# Patient Record
Sex: Male | Born: 1955 | Race: Black or African American | Hispanic: No | Marital: Single | Smoking: Never smoker
Health system: Southern US, Community
[De-identification: ages and names within clinical notes are randomized; demographics above are authoritative.]

## PROBLEM LIST (undated history)

## (undated) DIAGNOSIS — R519 Headache, unspecified: Secondary | ICD-10-CM

## (undated) DIAGNOSIS — I10 Essential (primary) hypertension: Secondary | ICD-10-CM

## (undated) DIAGNOSIS — Z86010 Personal history of colonic polyps: Secondary | ICD-10-CM

## (undated) DIAGNOSIS — R51 Headache: Secondary | ICD-10-CM

## (undated) HISTORY — DX: Essential (primary) hypertension: I10

## (undated) HISTORY — PX: FRACTURE SURGERY: SHX138

## (undated) HISTORY — DX: Personal history of colonic polyps: Z86.010

## (undated) HISTORY — DX: Headache: R51

## (undated) HISTORY — DX: Headache, unspecified: R51.9

---

## 2013-05-15 ENCOUNTER — Ambulatory Visit (INDEPENDENT_AMBULATORY_CARE_PROVIDER_SITE_OTHER): Payer: 59 | Admitting: Emergency Medicine

## 2013-05-15 VITALS — BP 162/110 | HR 66 | Temp 98.9°F | Resp 18 | Ht 70.5 in | Wt 208.0 lb

## 2013-05-15 DIAGNOSIS — I10 Essential (primary) hypertension: Secondary | ICD-10-CM

## 2013-05-15 DIAGNOSIS — R350 Frequency of micturition: Secondary | ICD-10-CM

## 2013-05-15 DIAGNOSIS — N400 Enlarged prostate without lower urinary tract symptoms: Secondary | ICD-10-CM

## 2013-05-15 DIAGNOSIS — N529 Male erectile dysfunction, unspecified: Secondary | ICD-10-CM

## 2013-05-15 LAB — POCT UA - MICROSCOPIC ONLY
Casts, Ur, LPF, POC: NEGATIVE
Crystals, Ur, HPF, POC: NEGATIVE
Mucus, UA: POSITIVE
Yeast, UA: NEGATIVE

## 2013-05-15 LAB — POCT URINALYSIS DIPSTICK
Bilirubin, UA: NEGATIVE
Blood, UA: NEGATIVE
Glucose, UA: NEGATIVE
Ketones, UA: NEGATIVE
Leukocytes, UA: NEGATIVE
Nitrite, UA: NEGATIVE
Protein, UA: NEGATIVE
Spec Grav, UA: 1.02
Urobilinogen, UA: 1
pH, UA: 6.5

## 2013-05-15 LAB — POCT CBC
HCT, POC: 47.2 % (ref 43.5–53.7)
MCH, POC: 29.3 pg (ref 27–31.2)
MCV: 91.5 fL (ref 80–97)
MID (cbc): 0.5 (ref 0–0.9)
MPV: 9.2 fL (ref 0–99.8)
POC LYMPH PERCENT: 41.9 %L (ref 10–50)
POC MID %: 8.5 %M (ref 0–12)
Platelet Count, POC: 238 10*3/uL (ref 142–424)
RBC: 5.16 M/uL (ref 4.69–6.13)
RDW, POC: 13.8 %
WBC: 5.3 10*3/uL (ref 4.6–10.2)

## 2013-05-15 MED ORDER — VARDENAFIL HCL 20 MG PO TABS: 20.0000 mg | ORAL_TABLET | Freq: Every day | ORAL | Status: DC | PRN

## 2013-05-15 MED ORDER — TAMSULOSIN HCL 0.4 MG PO CAPS: 0.4000 mg | ORAL_CAPSULE | Freq: Every day | ORAL | Status: DC

## 2013-05-15 MED ORDER — LOSARTAN POTASSIUM-HCTZ 50-12.5 MG PO TABS: 1.0000 | ORAL_TABLET | Freq: Every day | ORAL | Status: DC

## 2013-05-15 NOTE — Progress Notes (Signed)
Urgent Medical and [facility] [address] [place] [address] [phone]  Date:  05/15/2013   Name:  Garrett Perez   DOB:  08/15/1955   MRN:  [id number]  PCP:  No primary provider on file.    Chief Complaint: Urinary Frequency and Headache   History of Present Illness:  Garrett Perez is a 57 y.o. very pleasant male patient who presents with the following:  Multiple complaints.  No history of hypertension.  Has three year history of morning headaches usually go away but may last all day. Not associated with neuro or visual symptoms.   Respond to OTC medications.   Has frequent urination, small voids, dribbling decreased stream.  Nocturia.   Has 2-3 month history of ED.  Not able to maintain an erection suitable for intercourse. Has noticed tingling in the ulnar distribution of his left arm when he sits with his chin in his left hand, leaning on a counter or chair arm.  Symptoms resolve when he moves his arm.  There are no active problems to display for this patient.   History reviewed. No pertinent past medical history.  Past Surgical History  Procedure Laterality Date  . Fracture surgery      History  Substance Use Topics  . Smoking status: Never Smoker   . Smokeless tobacco: Not on file  . Alcohol Use: Not on file    History reviewed. No pertinent family history.  No Known Allergies  Medication list has been reviewed and updated.  No current outpatient prescriptions on file prior to visit.   No current facility-administered medications on file prior to visit.    Review of Systems:  As per HPI, otherwise negative.    Physical Examination: Filed Vitals:   05/15/13 1305  BP: 162/110  Pulse: 66  Temp: 98.9 F (37.2 C)  Resp: 18   Filed Vitals:   05/15/13 1305  Height: 5' 10.5" (1.791 m)  Weight: 208 lb (94.348 kg)   Body mass index is 29.41 kg/(m^2). Ideal Body Weight: Weight in (lb) to have BMI = 25: 176.4  GEN: WDWN, NAD, Non-toxic, A & O x  3 HEENT: Atraumatic, Normocephalic. Neck supple. No masses, No LAD.  Fundi negative Ears and Nose: No external deformity. CV: RRR, No M/G/R. No JVD. No thrill. No extra heart sounds. PULM: CTA B, no wheezes, crackles, rhonchi. No retractions. No resp. distress. No accessory muscle use. ABD: S, NT, ND, +BS. No rebound. No HSM. EXTR: No c/c/e NEURO Normal gait.  PSYCH: Normally interactive. Conversant. Not depressed or anxious appearing.  Calm demeanor.    Assessment and Plan: Hypertension BPH with prostatism Headaches likely secondary to hypertension. LABS EKG GI consult for colonoscopy  Signed,  Phillips Odor, MD

## 2013-05-15 NOTE — Patient Instructions (Signed)
Hypertension As your heart beats, it forces blood through your arteries. This force is your blood pressure. If the pressure is too high, it is called hypertension (HTN) or high blood pressure. HTN is dangerous because you may have it and not know it. High blood pressure may mean that your heart has to work harder to pump blood. Your arteries may be narrow or stiff. The extra work puts you at risk for heart disease, stroke, and other problems.  Blood pressure consists of two numbers, a higher number over a lower, 110/72, for example. It is stated as "110 over 72." The ideal is below 120 for the top number (systolic) and under 80 for the bottom (diastolic). Write down your blood pressure today. You should pay close attention to your blood pressure if you have certain conditions such as:  Heart failure.  Prior heart attack.  Diabetes  Chronic kidney disease.  Prior stroke.  Multiple risk factors for heart disease. To see if you have HTN, your blood pressure should be measured while you are seated with your arm held at the level of the heart. It should be measured at least twice. A one-time elevated blood pressure reading (especially in the Emergency Department) does not mean that you need treatment. There may be conditions in which the blood pressure is different between your right and left arms. It is important to see your caregiver soon for a recheck. Most people have essential hypertension which means that there is not a specific cause. This type of high blood pressure may be lowered by changing lifestyle factors such as:  Stress.  Smoking.  Lack of exercise.  Excessive weight.  Drug/tobacco/alcohol use.  Eating less salt. Most people do not have symptoms from high blood pressure until it has caused damage to the body. Effective treatment can often prevent, delay or reduce that damage. TREATMENT  When a cause has been identified, treatment for high blood pressure is directed at the  cause. There are a large number of medications to treat HTN. These fall into several categories, and your caregiver will help you select the medicines that are best for you. Medications may have side effects. You should review side effects with your caregiver. If your blood pressure stays high after you have made lifestyle changes or started on medicines,   Your medication(s) may need to be changed.  Other problems may need to be addressed.  Be certain you understand your prescriptions, and know how and when to take your medicine.  Be sure to follow up with your caregiver within the time frame advised (usually within two weeks) to have your blood pressure rechecked and to review your medications.  If you are taking more than one medicine to lower your blood pressure, make sure you know how and at what times they should be taken. Taking two medicines at the same time can result in blood pressure that is too low. SEEK IMMEDIATE MEDICAL CARE IF:  You develop a severe headache, blurred or changing vision, or confusion.  You have unusual weakness or numbness, or a faint feeling.  You have severe chest or abdominal pain, vomiting, or breathing problems. MAKE SURE YOU:   Understand these instructions.  Will watch your condition.  Will get help right away if you are not doing well or get worse. Document Released: 05/03/2005 Document Revised: 07/26/2011 Document Reviewed: 12/22/2007 [facility] Patient Information 2014 [place]. Erectile Dysfunction Erectile dysfunction is the inability to get or sustain a good enough erection to  have sexual intercourse. Erectile dysfunction may involve:  Inability to get an erection.  Lack of enough hardness to allow penetration.  Loss of the erection before sex is finished.  Premature ejaculation. CAUSES  Certain drugs, such as:  Pain relievers.  Antihistamines.  Antidepressants.  Blood pressure medicines.  Water pills  (diuretics).  Ulcer medicines.  Muscle relaxants.  Illegal drugs.  Excessive drinking.  Psychological causes, such as:  Anxiety.  Depression.  Sadness.  Exhaustion.  Performance fear.  Stress.  Physical causes, such as:  Artery problems. This may include diabetes, smoking, liver disease, or atherosclerosis.  High blood pressure.  Hormonal problems, such as low testosterone.  Obesity.  Nerve problems. This may include back or pelvic injuries, diabetes mellitus, multiple sclerosis, or Parkinson disease. SYMPTOMS  Inability to get an erection.  Lack of enough hardness to allow penetration.  Loss of the erection before sex is finished.  Premature ejaculation.  Normal erections at some times, but with frequent unsatisfactory episodes.  Orgasms that are not satisfactory in sensation or frequency.  Low sexual satisfaction in either partner because of erection problems.  A curved penis occurring with erection. The curve may cause pain or may be too curved to allow for intercourse.  Never having nighttime erections. DIAGNOSIS Your caregiver can often diagnose this condition by:  Performing a physical exam to find other diseases or specific problems with the penis.  Asking you detailed questions about the problem.  Performing blood tests to check for diabetes mellitus or to measure hormone levels.  Performing urine tests to find other underlying health conditions.  Performing an ultrasound exam to check for scarring.  Performing a test to check blood flow to the penis.  Doing a sleep study at home to measure nighttime erections. TREATMENT   You may be prescribed medicines by mouth.  You may be given medicine injections into the penis.  You may be prescribed a vacuum pump with a ring.  Penile implant surgery may be performed. You may receive:  An inflatable implant.  A semirigid implant.  Blood vessel surgery may be performed. HOME CARE  INSTRUCTIONS  If you are prescribed oral medicine, you should take the medicine as prescribed. Do not increase the dosage without first discussing it with your physician.  If you are using self-injections, be careful to avoid any veins that are on the surface of the penis. Apply pressure to the injection site for 5 minutes.  If you are using a vacuum pump, make sure you have read the instructions before using it. Discuss any questions with your physician before taking the pump home. SEEK MEDICAL CARE IF:  You experience pain that is not responsive to the pain medicine you have been prescribed.  You experience nausea or vomiting. SEEK IMMEDIATE MEDICAL CARE IF:   When taking oral or injectable medications, you experience an erection that lasts longer than 4 hours. If your physician is unavailable, go to the nearest emergency room for evaluation. An erection that lasts much longer than 4 hours can result in permanent damage to your penis.  You have pain that is severe.  You develop redness, severe pain, or severe swelling of your penis.  You have redness spreading up into your groin or lower abdomen.  You are unable to pass your urine. Document Released: 04/30/2000 Document Revised: 01/03/2013 Document Reviewed: 10/05/2012 [facility] Patient Information 2014 [place], [place].

## 2013-05-16 ENCOUNTER — Encounter: Payer: Self-pay | Admitting: Internal Medicine

## 2013-05-16 LAB — COMPREHENSIVE METABOLIC PANEL WITH GFR
ALT: 26 U/L (ref 0–53)
AST: 19 U/L (ref 0–37)
Albumin: 4.5 g/dL (ref 3.5–5.2)
Alkaline Phosphatase: 94 U/L (ref 39–117)
BUN: 14 mg/dL (ref 6–23)
CO2: 29 meq/L (ref 19–32)
Calcium: 9.7 mg/dL (ref 8.4–10.5)
Chloride: 101 meq/L (ref 96–112)
Creat: 1.22 mg/dL (ref 0.50–1.35)
Glucose, Bld: 89 mg/dL (ref 70–99)
Potassium: 3.6 meq/L (ref 3.5–5.3)
Sodium: 139 meq/L (ref 135–145)
Total Bilirubin: 0.7 mg/dL (ref 0.3–1.2)
Total Protein: 7.8 g/dL (ref 6.0–8.3)

## 2013-05-16 LAB — LIPID PANEL
Cholesterol: 194 mg/dL (ref 0–200)
HDL: 51 mg/dL (ref >39)
LDL Cholesterol: 130 mg/dL — ABNORMAL HIGH (ref 0–99)
Total CHOL/HDL Ratio: 3.8 ratio
Triglycerides: 63 mg/dL (ref <150)
VLDL: 13 mg/dL (ref 0–40)

## 2013-05-16 LAB — TESTOSTERONE: Testosterone: 575 ng/dL (ref 300–890)

## 2013-05-16 LAB — PSA: PSA: 1.98 ng/mL (ref <=4.00)

## 2013-05-28 ENCOUNTER — Ambulatory Visit (AMBULATORY_SURGERY_CENTER): Payer: Self-pay

## 2013-05-28 VITALS — Ht 71.0 in | Wt 203.0 lb

## 2013-05-28 DIAGNOSIS — Z1211 Encounter for screening for malignant neoplasm of colon: Secondary | ICD-10-CM

## 2013-05-28 MED ORDER — SUPREP BOWEL PREP KIT 17.5-3.13-1.6 GM/177ML PO SOLN: 1.0000 | Freq: Once | ORAL | Status: DC

## 2013-05-30 ENCOUNTER — Encounter: Payer: Self-pay | Admitting: Internal Medicine

## 2013-06-11 ENCOUNTER — Encounter: Payer: Self-pay | Admitting: Internal Medicine

## 2013-06-11 ENCOUNTER — Ambulatory Visit (AMBULATORY_SURGERY_CENTER): Payer: 59 | Admitting: Internal Medicine

## 2013-06-11 VITALS — BP 151/93 | HR 54 | Temp 97.1°F | Resp 12 | Ht 71.0 in | Wt 203.0 lb

## 2013-06-11 DIAGNOSIS — Z860100 Personal history of colon polyps, unspecified: Secondary | ICD-10-CM

## 2013-06-11 DIAGNOSIS — Z1211 Encounter for screening for malignant neoplasm of colon: Secondary | ICD-10-CM

## 2013-06-11 DIAGNOSIS — D126 Benign neoplasm of colon, unspecified: Secondary | ICD-10-CM

## 2013-06-11 HISTORY — DX: Personal history of colonic polyps: Z86.010

## 2013-06-11 HISTORY — DX: Personal history of colon polyps, unspecified: Z86.0100

## 2013-06-11 MED ORDER — SODIUM CHLORIDE 0.9 % IV SOLN: 500.0000 mL | INTRAVENOUS | Status: DC

## 2013-06-11 NOTE — Progress Notes (Signed)
Room staff made aware of blood pressure of 151/11 left arm and 148/111 right arm no new orders received.

## 2013-06-11 NOTE — Progress Notes (Signed)
Procedure ends, to recovery, report given and VSS. 

## 2013-06-11 NOTE — Progress Notes (Signed)
Called to room to assist during endoscopic procedure.  Patient ID and intended procedure confirmed with present staff. Received instructions for my participation in the procedure from the performing physician.  

## 2013-06-11 NOTE — Patient Instructions (Addendum)
I found and removed 3 polyps today.  Everything else was ok.  I will let you know pathology results and when to have another routine colonoscopy by mail.  I appreciate the opportunity to care for you. Gatha Mayer, MD, FACG   YOU HAD AN ENDOSCOPIC PROCEDURE TODAY AT [facility]: Refer to the procedure report that was given to you for any specific questions about what was found during the examination.  If the procedure report does not answer your questions, please call your gastroenterologist to clarify.  If you requested that your care partner not be given the details of your procedure findings, then the procedure report has been included in a sealed envelope for you to review at your convenience later.  YOU SHOULD EXPECT: Some feelings of bloating in the abdomen. Passage of more gas than usual.  Walking can help get rid of the air that was put into your GI tract during the procedure and reduce the bloating. If you had a lower endoscopy (such as a colonoscopy or flexible sigmoidoscopy) you may notice spotting of blood in your stool or on the toilet paper. If you underwent a bowel prep for your procedure, then you may not have a normal bowel movement for a few days.  DIET: Your first meal following the procedure should be a light meal and then it is ok to progress to your normal diet.  A half-sandwich or bowl of soup is an example of a good first meal.  Heavy or fried foods are harder to digest and may make you feel nauseous or bloated.  Likewise meals heavy in dairy and vegetables can cause extra gas to form and this can also increase the bloating.  Drink plenty of fluids but you should avoid alcoholic beverages for 24 hours.  ACTIVITY: Your care partner should take you home directly after the procedure.  You should plan to take it easy, moving slowly for the rest of the day.  You can resume normal activity the day after the procedure however you should NOT DRIVE or use heavy  machinery for 24 hours (because of the sedation medicines used during the test).    SYMPTOMS TO REPORT IMMEDIATELY: A gastroenterologist can be reached at any hour.  During normal business hours, 8:30 AM to 5:00 PM Monday through Friday, call [phone].  After hours and on weekends, please call the GI answering service at [phone] who will take a message and have the physician on call contact you.   Following lower endoscopy (colonoscopy or flexible sigmoidoscopy):  Excessive amounts of blood in the stool  Significant tenderness or worsening of abdominal pains  Swelling of the abdomen that is new, acute  Fever of 100F or higher  Following upper endoscopy (EGD)  Vomiting of blood or coffee ground material  New chest pain or pain under the shoulder blades  Painful or persistently difficult swallowing  New shortness of breath  Fever of 100F or higher  Black, tarry-looking stools  FOLLOW UP: If any biopsies were taken you will be contacted by phone or by letter within the next 1-3 weeks.  Call your gastroenterologist if you have not heard about the biopsies in 3 weeks.  Our staff will call the home number listed on your records the next business day following your procedure to check on you and address any questions or concerns that you may have at that time regarding the information given to you following your procedure. This is a Manufacturing engineer  call and so if there is no answer at the home number and we have not heard from you through the emergency physician on call, we will assume that you have returned to your regular daily activities without incident.  SIGNATURES/CONFIDENTIALITY: You and/or your care partner have signed paperwork which will be entered into your electronic medical record.  These signatures attest to the fact that that the information above on your After Visit Summary has been reviewed and is understood.  Full responsibility of the confidentiality of this discharge  information lies with you and/or your care-partner.   Information on polyps given to you today  Hold aspirin and anti inflammatory products for 2 weeks

## 2013-06-11 NOTE — Op Note (Signed)
[facility]  [address] [place], [address]   COLONOSCOPY PROCEDURE REPORT  PATIENT: Garrett Perez, Garrett Perez  MR#: [id number] BIRTHDATE: [birth year] , 69  yrs. old GENDER: Male ENDOSCOPIST: Gatha Mayer, MD, [facility] REFERRED BY:   Synetta Shadow, MD PROCEDURE DATE:  06/11/2013 PROCEDURE:   Colonoscopy with snare polypectomy First Screening Colonoscopy - Avg.  risk and is 50 yrs.  old or older Yes.  Prior Negative Screening - Now for repeat screening. N/A  History of Adenoma - Now for follow-up colonoscopy & has been > or = to 3 yrs.  N/A  Polyps Removed Today? Yes. ASA CLASS:   Class II INDICATIONS:average risk screening and first colonoscopy. MEDICATIONS: propofol (Diprivan) 250mg  IV  DESCRIPTION OF PROCEDURE:   After the risks benefits and alternatives of the procedure were thoroughly explained, informed consent was obtained.  A digital rectal exam revealed no abnormalities of the rectum, A digital rectal exam revealed no prostatic nodules, and A digital rectal exam revealed the prostate was not enlarged.   The LB [id number]  endoscope was introduced through the anus and advanced to the cecum, which was identified by both the appendix and ileocecal valve. No adverse events experienced.   The quality of the prep was Suprep good  The instrument was then slowly withdrawn as the colon was fully examined.  COLON FINDINGS: Three sessile polyps measuring 3, 5 and 15 mm in size were found at the cecum and in the ascending colon.  A polypectomy was performed with a cold snare (3 and 5 mm)  and using snare cautery (15).  The resection was complete and the polyp tissue was completely retrieved.   The colon mucosa was otherwise normal.   A right colon retroflexion was performed.  Retroflexed views revealed hypertrophied anal papilla. The time to cecum=3 minutes 27 seconds.  Withdrawal time=13 minutes 25 seconds.  The scope was withdrawn and the procedure  completed. COMPLICATIONS: There were no complications.  ENDOSCOPIC IMPRESSION: 1.   Three sessile polyps measuring 3, 5 and 15 mm in size were found at the cecum and in the ascending colon; polypectomy was performed with a cold snare and using snare cautery 2.   The colon mucosa was otherwise normal - good prep - first colonoscopy. He does have a hypertorophied anal papilla  RECOMMENDATIONS: 1.  Timing of repeat colonoscopy will be determined by pathology findings. 2.  Hold aspirin, aspirin products, and anti-inflammatory medication for 2 weeks.   eSigned:  Gatha Mayer, MD, [facility] 06/11/2013 2:00 PM   cc: The Patient  and  Synetta Shadow, MD

## 2013-06-12 ENCOUNTER — Telehealth: Payer: Self-pay | Admitting: *Deleted

## 2013-06-12 NOTE — Telephone Encounter (Signed)
  Follow up Call-  Call back number 06/11/2013  Post procedure Call Back phone  # [phone]  Permission to leave phone message Yes     Patient questions:  Message left for patient to call us if needed.

## 2013-06-14 ENCOUNTER — Encounter: Payer: Self-pay | Admitting: Internal Medicine

## 2013-06-14 NOTE — Progress Notes (Signed)
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

## 2013-09-26 ENCOUNTER — Other Ambulatory Visit: Payer: Self-pay | Admitting: Emergency Medicine

## 2013-11-23 ENCOUNTER — Ambulatory Visit (INDEPENDENT_AMBULATORY_CARE_PROVIDER_SITE_OTHER): Payer: 59 | Admitting: Emergency Medicine

## 2013-11-23 VITALS — BP 134/92 | HR 60 | Temp 97.8°F | Resp 16 | Ht 70.25 in | Wt 208.2 lb

## 2013-11-23 DIAGNOSIS — G44209 Tension-type headache, unspecified, not intractable: Secondary | ICD-10-CM

## 2013-11-23 DIAGNOSIS — I10 Essential (primary) hypertension: Secondary | ICD-10-CM

## 2013-11-23 MED ORDER — BUTALBITAL-APAP-CAFFEINE 50-325-40 MG PO TABS: 1.0000 | ORAL_TABLET | Freq: Two times a day (BID) | ORAL | Status: DC | PRN

## 2013-11-23 MED ORDER — LOSARTAN POTASSIUM-HCTZ 100-25 MG PO TABS: 1.0000 | ORAL_TABLET | Freq: Every day | ORAL | Status: DC

## 2013-11-23 NOTE — Progress Notes (Signed)
[facility]gent Medical and [facility] [address] [place] [address] [phone]  Date:  11/23/2013   Name:  Garrett Perez   DOB:  05-26-1955   MRN:  [id number]  PCP:  No PCP Per Patient    Chief Complaint: Headache   History of Present Illness:  Garrett Perez is a 58 y.o. very pleasant male patient who presents with the following:  Says he has a history of headaches over the past week.  Says this morning was back of head and now between his eyes.  Often bitemporal.    No photophobia or sensitivity to sound.  History of hypertension under treatment.  No associated neuro or visual symptoms.  No neck pain, cough, coryza, nausea or vomiting.  No stool change, fever or chills.  Says is compliant with medications.  No improvement with over the counter medications or other home remedies. Denies other complaint or health concern today.   Patient Active Problem List   Diagnosis Date Noted  . Personal history of colonic polyps - adenomas 06/11/2013    Past Medical History  Diagnosis Date  . Headache   . Hypertension   . Personal history of colonic polyps - adenomas 06/11/2013    06/11/2013 15 mm cecal polyp, 3 mm cecal polyp, 5 mm ascending polyp - adenomas - repeat colonoscopy 2018      Past Surgical History  Procedure Laterality Date  . Fracture surgery      History  Substance Use Topics  . Smoking status: Never Smoker   . Smokeless tobacco: Never Used  . Alcohol Use: No    Family History  Problem Relation Age of Onset  . Colon cancer Neg Hx     No Known Allergies  Medication list has been reviewed and updated.  Current Outpatient Prescriptions on File Prior to Visit  Medication Sig Dispense Refill  . Aspirin-Acetaminophen-Caffeine (GOODY HEADACHE PO) Take 1 packet by mouth as needed.      Marland Kitchen losartan-hydrochlorothiazide (HYZAAR) 50-12.5 MG per tablet TAKE 1 TABLET BY MOUTH DAILY.  30 tablet  0   No current facility-administered medications on file prior to visit.     Review of Systems:  As per HPI, otherwise negative.    Physical Examination: Filed Vitals:   11/23/13 1715  BP: 134/92  Pulse: 60  Temp: 97.8 F (36.6 C)  Resp: 16   Filed Vitals:   11/23/13 1715  Height: 5' 10.25" (1.784 m)  Weight: 208 lb 3.2 oz (94.439 kg)   Body mass index is 29.67 kg/(m^2). Ideal Body Weight: Weight in (lb) to have BMI = 25: 175.1  GEN: WDWN, NAD, Non-toxic, A & O x 3 HEENT: Atraumatic, Normocephalic. Neck supple. No masses, No LAD. Ears and Nose: No external deformity. CV: RRR, No M/G/R. No JVD. No thrill. No extra heart sounds. PULM: CTA B, no wheezes, crackles, rhonchi. No retractions. No resp. distress. No accessory muscle use. ABD: S, NT, ND, +BS. No rebound. No HSM. EXTR: No c/c/e NEURO Normal gait.  PSYCH: Normally interactive. Conversant. Not depressed or anxious appearing.  Calm demeanor.    Assessment and Plan: Hypertension Increase meds  Tension headache fioricet  Signed,  Ellison Carwin, MD

## 2013-11-23 NOTE — Patient Instructions (Signed)

## 2014-04-10 ENCOUNTER — Telehealth: Payer: Self-pay

## 2014-04-10 NOTE — Telephone Encounter (Signed)
Pt's dentist office called to report on pt's BP and see if it was normal for him and if OK to give anesthesia for dental work. They stated they have checked it twice a wk apart and both times the diastolic was over 809 (she didn't have the exact reading in front of her, but thought it was 109). I advised we haven't seen pt since July and that if it is that high, we need to see pt back for re-check to get this under control. Nurse agreed to call pt, delay dental work, and have him come back in for BP check up asap.

## 2015-06-13 ENCOUNTER — Ambulatory Visit
Admission: RE | Admit: 2015-06-13 | Discharge: 2015-06-13 | Disposition: A | Discharge status: Home / Self Care | Payer: Managed Care, Other (non HMO) | Source: Ambulatory Visit | Attending: Internal Medicine | Admitting: Internal Medicine

## 2015-06-13 ENCOUNTER — Other Ambulatory Visit: Payer: Self-pay | Admitting: Internal Medicine

## 2015-06-13 DIAGNOSIS — K5900 Constipation, unspecified: Secondary | ICD-10-CM

## 2016-05-31 ENCOUNTER — Encounter: Payer: Self-pay | Admitting: Internal Medicine

## 2016-06-25 DIAGNOSIS — Z0000 Encounter for general adult medical examination without abnormal findings: Secondary | ICD-10-CM | POA: Diagnosis not present

## 2016-07-26 DIAGNOSIS — D7289 Other specified disorders of white blood cells: Secondary | ICD-10-CM | POA: Diagnosis not present

## 2016-08-02 DIAGNOSIS — D721 Eosinophilia: Secondary | ICD-10-CM | POA: Diagnosis not present

## 2019-11-28 ENCOUNTER — Ambulatory Visit: Payer: Self-pay

## 2019-11-28 ENCOUNTER — Encounter (HOSPITAL_BASED_OUTPATIENT_CLINIC_OR_DEPARTMENT_OTHER): Payer: Self-pay | Admitting: *Deleted

## 2019-11-28 ENCOUNTER — Emergency Department (HOSPITAL_BASED_OUTPATIENT_CLINIC_OR_DEPARTMENT_OTHER): Payer: 59

## 2019-11-28 ENCOUNTER — Emergency Department (HOSPITAL_BASED_OUTPATIENT_CLINIC_OR_DEPARTMENT_OTHER)
Admission: EM | Admit: 2019-11-28 | Discharge: 2019-11-28 | Disposition: A | Discharge status: Home / Self Care | Payer: 59 | Attending: Emergency Medicine | Admitting: Emergency Medicine

## 2019-11-28 ENCOUNTER — Encounter: Payer: Self-pay | Admitting: Family Medicine

## 2019-11-28 ENCOUNTER — Ambulatory Visit: Payer: 59 | Admitting: Family Medicine

## 2019-11-28 ENCOUNTER — Other Ambulatory Visit: Payer: Self-pay

## 2019-11-28 VITALS — BP 151/94 | HR 70 | Ht 70.0 in | Wt 183.0 lb

## 2019-11-28 DIAGNOSIS — I10 Essential (primary) hypertension: Secondary | ICD-10-CM | POA: Insufficient documentation

## 2019-11-28 DIAGNOSIS — M25562 Pain in left knee: Secondary | ICD-10-CM | POA: Diagnosis present

## 2019-11-28 DIAGNOSIS — M1712 Unilateral primary osteoarthritis, left knee: Secondary | ICD-10-CM | POA: Diagnosis not present

## 2019-11-28 DIAGNOSIS — M25462 Effusion, left knee: Secondary | ICD-10-CM | POA: Diagnosis not present

## 2019-11-28 MED ORDER — IBUPROFEN-FAMOTIDINE 800-26.6 MG PO TABS: 1.0000 | ORAL_TABLET | Freq: Three times a day (TID) | ORAL | 3 refills | Status: DC | PRN

## 2019-11-28 MED ORDER — PREDNISONE 5 MG PO TABS
ORAL_TABLET | ORAL | 0 refills | Status: DC
Start: 2019-11-28 — End: 2021-07-07

## 2019-11-28 MED FILL — predniSONE 5 MG TABS: 5 | 6 days supply | Qty: 21 | Fill #0

## 2019-11-28 NOTE — Progress Notes (Signed)
Garrett Perez - 64 y.o. male MRN [id number]  Date of birth: [birth year]  SUBJECTIVE:  Including CC & ROS.  Chief Complaint  Patient presents with  . Knee Pain    left x 1 week    Garrett Perez is a 64 y.o. male that is presenting with acute left knee pain.  No history of similar pain.  He works 2 different jobs.  He walks on along with a second job.  He denies any inciting event or trauma.  Started having knee swelling that is progressively gotten worse.  He lacks full range of motion.  No history of gout.  No history of surgery..  Independent review of the left knee x-ray from 7/14 shows signs of an effusion.  Has moderate lateral joint space changes.   Review of Systems See HPI   HISTORY: Past Medical, Surgical, Social, and Family History Reviewed & Updated per EMR.   Pertinent Historical Findings include:  Past Medical History:  Diagnosis Date  . Headache   . Hypertension   . Personal history of colonic polyps - adenomas 06/11/2013   06/11/2013 15 mm cecal polyp, 3 mm cecal polyp, 5 mm ascending polyp - adenomas - repeat colonoscopy 2018      Past Surgical History:  Procedure Laterality Date  . FRACTURE SURGERY     Right hand    Family History  Problem Relation Age of Onset  . Colon cancer Neg Hx     Social History   Socioeconomic History  . Marital status: Single    Spouse name: Not on file  . Number of children: Not on file  . Years of education: Not on file  . Highest education level: Not on file  Occupational History  . Not on file  Tobacco Use  . Smoking status: Never Smoker  . Smokeless tobacco: Never Used  Substance and Sexual Activity  . Alcohol use: No  . Drug use: No  . Sexual activity: Yes  Other Topics Concern  . Not on file  Social History Narrative  . Not on file   Social Determinants of Health   Financial Resource Strain:   . Difficulty of Paying Living Expenses:   Food Insecurity:   . Worried About Charity fundraiser in the Last Year:   .  Arboriculturist in the Last Year:   Transportation Needs:   . Film/video editor (Medical):   Marland Kitchen Lack of Transportation (Non-Medical):   Physical Activity:   . Days of Exercise per Week:   . Minutes of Exercise per Session:   Stress:   . Feeling of Stress :   Social Connections:   . Frequency of Communication with Friends and Family:   . Frequency of Social Gatherings with Friends and Family:   . Attends Religious Services:   . Active Member of Clubs or Organizations:   . Attends Archivist Meetings:   Marland Kitchen Marital Status:   Intimate Partner Violence:   . Fear of Current or Ex-Partner:   . Emotionally Abused:   Marland Kitchen Physically Abused:   . Sexually Abused:      PHYSICAL EXAM:  VS: BP (!) 151/94   Pulse 70   Ht 5\' 10"  (1.778 m)   Wt 183 lb (83 kg)   BMI 26.26 kg/m  Physical Exam Gen: NAD, alert, cooperative with exam, well-appearing MSK:  Left knee: Moderate effusion. Limited extension. Normal strength resistance. Tenderness to palpation of lateral joint space. Neurovascular intact  Limited ultrasound:  Left knee:  Moderate effusion. Normal-appearing quadricep and patellar tendon. Normal-appearing medial joint space and meniscus. Moderate to severe lateral joint space narrowing with hyperechoic changes to suggest possible pseudogout.  Summary: Degenerative changes lateral compartment with possible pseudogout changes.  Ultrasound and interpretation by Clearance Coots, MD    ASSESSMENT & PLAN:   Primary osteoarthritis of left knee No history of similar pain at [facility] without incident.  Has hyperemia over the lateral aspect of the joint space as well as the femoral condyle.  Possible for pseudogout to be contributing.  He does have degenerative changes in the lateral aspect. -Counseled on home exercise therapy and supportive care. -Prednisone. -Duexis. -Could consider aspiration injection if no improvement.

## 2019-11-28 NOTE — ED Triage Notes (Addendum)
Left knee pain for over a week.  Denies injury.

## 2019-11-28 NOTE — Patient Instructions (Signed)
Nice to meet you Please try ice  Please continue compression  Please try the prednisone  Please use the duexis as needed after the prednisone  Cut down your running and then slowly build back up as your pain allows   Please send me a message in MyChart with any questions or updates.  Please see me back in 4 weeks.   --Dr. Raeford Razor

## 2019-11-28 NOTE — Discharge Instructions (Signed)
Your history and exam are consistent with knee pain likely related to inflammatory arthritis.  We discussed the possibility of doing an aspiration to look for gout or infection however given her otherwise reassuring exam and improving symptoms, we agreed together to hold on the aspiration and instead treat you with a more sturdy knee sleeve, sports medicine follow-up, anti-inflammatory regimen to see if this improves and close follow-up.  Please use the rest, ice, compression, elevation to help with the discomfort.  Please rest for the next 2 days.  If any symptoms change or worsen, please return as you would likely need knee aspiration to further evaluate at that time.

## 2019-11-28 NOTE — Assessment & Plan Note (Signed)
No history of similar pain at [facility] without incident.  Has hyperemia over the lateral aspect of the joint space as well as the femoral condyle.  Possible for pseudogout to be contributing.  He does have degenerative changes in the lateral aspect. -Counseled on home exercise therapy and supportive care. -Prednisone. -Duexis. -Could consider aspiration injection if no improvement.

## 2019-11-28 NOTE — ED Provider Notes (Signed)
[facility] EMERGENCY DEPARTMENT Provider Note   CSN: [id number] Arrival date & time: 11/28/19  8119     History Chief Complaint  Patient presents with  . Knee Pain    Garrett Perez is a 64 y.o. male.  The history is provided by the patient and medical records. No language interpreter was used.  Knee Pain Location:  Knee Time since incident:  6 days Injury: no   Knee location:  L knee Pain details:    Quality:  Aching   Radiates to:  Does not radiate   Severity:  Moderate   Onset quality:  Gradual   Duration:  6 days   Timing:  Sporadic   Progression:  Resolved Chronicity:  New Dislocation: no   Tetanus status:  Unknown Prior injury to area:  No Relieved by:  Immobilization Worsened by:  Nothing Ineffective treatments:  None tried Associated symptoms: stiffness and swelling   Associated symptoms: no back pain, no decreased ROM, no fatigue, no fever, no itching, no muscle weakness, no neck pain, no numbness and no tingling        Past Medical History:  Diagnosis Date  . Headache   . Hypertension   . Personal history of colonic polyps - adenomas 06/11/2013   06/11/2013 15 mm cecal polyp, 3 mm cecal polyp, 5 mm ascending polyp - adenomas - repeat colonoscopy 2018      Patient Active Problem List   Diagnosis Date Noted  . Personal history of colonic polyps - adenomas 06/11/2013    Past Surgical History:  Procedure Laterality Date  . FRACTURE SURGERY     Right hand       Family History  Problem Relation Age of Onset  . Colon cancer Neg Hx     Social History   Tobacco Use  . Smoking status: Never Smoker  . Smokeless tobacco: Never Used  Substance Use Topics  . Alcohol use: No  . Drug use: No    Home Medications Prior to Admission medications   Not on File    Allergies    Patient has no known allergies.  Review of Systems   Review of Systems  Constitutional: Negative for chills, fatigue and fever.  HENT: Negative for congestion.    Eyes: Negative for visual disturbance.  Respiratory: Negative for cough, chest tightness, shortness of breath and wheezing.   Cardiovascular: Negative for chest pain and palpitations.  Gastrointestinal: Negative for constipation, diarrhea, nausea and vomiting.  Genitourinary: Negative for flank pain.  Musculoskeletal: Positive for joint swelling and stiffness. Negative for back pain, neck pain and neck stiffness.  Skin: Negative for itching, rash and wound.  Neurological: Negative for headaches.  Psychiatric/Behavioral: Negative for agitation.  All other systems reviewed and are negative.   Physical Exam Updated Vital Signs BP (!) 171/114 (BP Location: Left Arm)   Pulse 73   Temp 98.2 F (36.8 C) (Oral)   Resp 18   Ht 5\' 10"  (1.778 m)   Wt 83 kg   SpO2 98%   BMI 26.26 kg/m   Physical Exam Constitutional:      General: He is not in acute distress.    Appearance: He is well-developed. He is not ill-appearing, toxic-appearing or diaphoretic.  HENT:     Head: Normocephalic and atraumatic.     Right Ear: External ear normal.     Left Ear: External ear normal.     Nose: Nose normal.  Eyes:     Conjunctiva/sclera: Conjunctivae normal.  Cardiovascular:  Rate and Rhythm: Normal rate.     Pulses: Normal pulses.  Pulmonary:     Effort: Pulmonary effort is normal. No respiratory distress.     Breath sounds: No stridor. No wheezing, rhonchi or rales.  Chest:     Chest wall: No tenderness.  Abdominal:     General: Abdomen is flat.     Palpations: Abdomen is soft.     Tenderness: There is no abdominal tenderness. There is no left CVA tenderness, guarding or rebound.  Musculoskeletal:        General: Tenderness present. No swelling.     Cervical back: Normal range of motion and neck supple.     Right lower leg: No edema.     Left lower leg: No edema.  Skin:    General: Skin is warm.     Capillary Refill: Capillary refill takes less than 2 seconds.     Coloration: Skin is  not pale.     Findings: No erythema or rash.  Neurological:     Mental Status: He is alert and oriented to person, place, and time.     Sensory: No sensory deficit.     Motor: No weakness or abnormal muscle tone.  Psychiatric:        Mood and Affect: Mood normal.     ED Results / Procedures / Treatments   Labs (all labs ordered are listed, but only abnormal results are displayed) Labs Reviewed - No data to display  EKG None  Radiology DG Knee Complete 4 Views Left  Result Date: 11/28/2019 CLINICAL DATA:  Left knee pain and swelling for 1 week. EXAM: LEFT KNEE - COMPLETE 4+ VIEW COMPARISON:  None. FINDINGS: Moderate tricompartmental degenerative changes most notable in the lateral compartment with joint space narrowing and spurring. No acute bony abnormality or osteochondral lesion. No definite erosions or chondrocalcinosis. Moderate to large joint effusion noted. IMPRESSION: 1. Moderate tricompartmental degenerative changes most notable in the lateral compartment. 2. No acute bony abnormality. 3. Moderate to large joint effusion. Electronically Signed   By: Marijo Sanes M.D.   On: 11/28/2019 08:33    Procedures Procedures (including critical care time)  Medications Ordered in ED Medications - No data to display  ED Course  I have reviewed the triage vital signs and the nursing notes.  Pertinent labs & imaging results that were available during my care of the patient were reviewed by me and considered in my medical decision making (see chart for details).    MDM Rules/Calculators/A&P                          Garrett Perez is a 64 y.o. male with a past medical history significant for hypertension who presents with left knee pain.  He reports that he works 2 jobs and is on his feet all the time.  He reports the last 6 or 7 days he is having some left knee pain and swelling.  He reports it has been gradually getting better since he started using knee immobilizer and only hurts when  he stopped moving for a period of time.  He feels like it starts to freeze.  He reports it does not hurt when he is not moving or when he is walking.  It does not hurt when he is bending his knee.  He reports no history of gout, septic joint, or trauma.  He denies history of DVT or PE.  He denies any numbness,  tingling, or weakness of the leg.  Denies any pain in the hip, chest, back, abdomen, or any infectious symptoms.  He denies any overlying erythema warmth, or redness.  He denies any overlying skin infections.  He reports that a knee sleeve is started to help but he had too much pain last night to be able to work prompting him to come in.  On exam, lungs are clear and chest is nontender.  Abdomen is nontender.  Has good pulses in both DP and PT arteries in both feet.  Patient's left knee is more swollen compared to the right but there is no overlying erythema.  Minimal tenderness on exam.  Minimal pain with knee manipulation.  No overlying cellulitis seen.  No bony tenderness.  Clinical aspect patient has some inflammatory arthritis likely related to overuse.  He has no history of gout and we discussed the possibility of needing to a knee aspiration however he would rather get x-ray first to evaluate.  As his symptoms are already improving, we will discuss further management after x-rays completed.  Given his lack of constant pain or pain with knee manipulation I have low suspicion that patient has a septic arthritis at this time.  Will discuss further management with patient.  11:02 AM Knee x-ray showed tricompartmental degenerative disease and effusion.  No bony abnormalities otherwise seen.  Patient still is pain-free when he is at rest and only hurts after he is ambulating and then starts to relax.  I suspect this is more a reactive or inflammatory arthropathy.  We discussed the possibility of knee aspiration to look for gout or infection however given his improving symptoms, patient agrees to hold  on this and instead follow-up with sports medicine, use rice therapy, use a more substantial knee sleeve, and take anti-inflammatory medications.  However, if symptoms worsen or he develops any symptoms of infection or worsened pain, patient does agree to return so that he can have any aspiration to rule out infection or gout.  Patient understands plan of care and has no other questions or concerns.  Patient discharged in good condition with improving symptoms.     Final Clinical Impression(s) / ED Diagnoses Final diagnoses:  Acute pain of left knee  Effusion of left knee  Arthritis of knee, left    Rx / DC Orders ED Discharge Orders    None     Clinical Impression: 1. Acute pain of left knee   2. Effusion of left knee   3. Arthritis of knee, left     Disposition: Discharge  Condition: Good  I have discussed the results, Dx and Tx plan with the pt(& family if present). He/she/they expressed understanding and agree(s) with the plan. Discharge instructions discussed at great length. Strict return precautions discussed and pt &/or family have verbalized understanding of the instructions. No further questions at time of discharge.    New Prescriptions   No medications on file    Follow Up: Rosemarie Ax, MD [address] [place]  [id number] [phone]        Mccall Will, Gwenyth Allegra, MD 11/28/19 [phone]

## 2019-12-31 ENCOUNTER — Other Ambulatory Visit: Payer: Self-pay

## 2019-12-31 ENCOUNTER — Encounter: Payer: Self-pay | Admitting: Family Medicine

## 2019-12-31 ENCOUNTER — Ambulatory Visit: Payer: 59 | Admitting: Family Medicine

## 2019-12-31 DIAGNOSIS — M1712 Unilateral primary osteoarthritis, left knee: Secondary | ICD-10-CM

## 2019-12-31 NOTE — Assessment & Plan Note (Signed)
Has been doing well with conservative measures.  He has been able to return to normal activities. -Counseled on home exercise therapy and supportive care. -Could consider physical therapy.

## 2019-12-31 NOTE — Progress Notes (Signed)
  Bernon Arviso - 64 y.o. male MRN [id number]  Date of birth: 1956/02/19  SUBJECTIVE:  Including CC & ROS.  Chief Complaint  Patient presents with  . Follow-up    left knee    Toan Mort is a 64 y.o. male that is following up for his left knee.  Has been doing well with little to no pain.  Has been able to return to his normal exercises.   Review of Systems See HPI   HISTORY: Past Medical, Surgical, Social, and Family History Reviewed & Updated per EMR.   Pertinent Historical Findings include:  Past Medical History:  Diagnosis Date  . Headache   . Hypertension   . Personal history of colonic polyps - adenomas 06/11/2013   06/11/2013 15 mm cecal polyp, 3 mm cecal polyp, 5 mm ascending polyp - adenomas - repeat colonoscopy 2018      Past Surgical History:  Procedure Laterality Date  . FRACTURE SURGERY     Right hand    Family History  Problem Relation Age of Onset  . Colon cancer Neg Hx     Social History   Socioeconomic History  . Marital status: Single    Spouse name: Not on file  . Number of children: Not on file  . Years of education: Not on file  . Highest education level: Not on file  Occupational History  . Not on file  Tobacco Use  . Smoking status: Never Smoker  . Smokeless tobacco: Never Used  Substance and Sexual Activity  . Alcohol use: No  . Drug use: No  . Sexual activity: Yes  Other Topics Concern  . Not on file  Social History Narrative  . Not on file   Social Determinants of Health   Financial Resource Strain:   . Difficulty of Paying Living Expenses:   Food Insecurity:   . Worried About Charity fundraiser in the Last Year:   . Arboriculturist in the Last Year:   Transportation Needs:   . Film/video editor (Medical):   Marland Kitchen Lack of Transportation (Non-Medical):   Physical Activity:   . Days of Exercise per Week:   . Minutes of Exercise per Session:   Stress:   . Feeling of Stress :   Social Connections:   . Frequency of  Communication with Friends and Family:   . Frequency of Social Gatherings with Friends and Family:   . Attends Religious Services:   . Active Member of Clubs or Organizations:   . Attends Archivist Meetings:   Marland Kitchen Marital Status:   Intimate Partner Violence:   . Fear of Current or Ex-Partner:   . Emotionally Abused:   Marland Kitchen Physically Abused:   . Sexually Abused:      PHYSICAL EXAM:  VS: BP (!) 155/102   Pulse 60   Ht 5\' 10"  (1.778 m)   Wt 185 lb (83.9 kg)   BMI 26.54 kg/m  Physical Exam Gen: NAD, alert, cooperative with exam, well-appearing MSK:  Left knee: Normal range of motion. Neurovascularly intact     ASSESSMENT & PLAN:   Primary osteoarthritis of left knee Has been doing well with conservative measures.  He has been able to return to normal activities. -Counseled on home exercise therapy and supportive care. -Could consider physical therapy.

## 2021-04-06 IMAGING — CR DG KNEE COMPLETE 4+V*L*
4 series · 4 of 4 positions shown · non-contrast
Comparison: None.

CLINICAL DATA: Left knee pain and swelling for 1 week.

EXAM:
LEFT KNEE - COMPLETE 4+ VIEW

[t knee ap left]
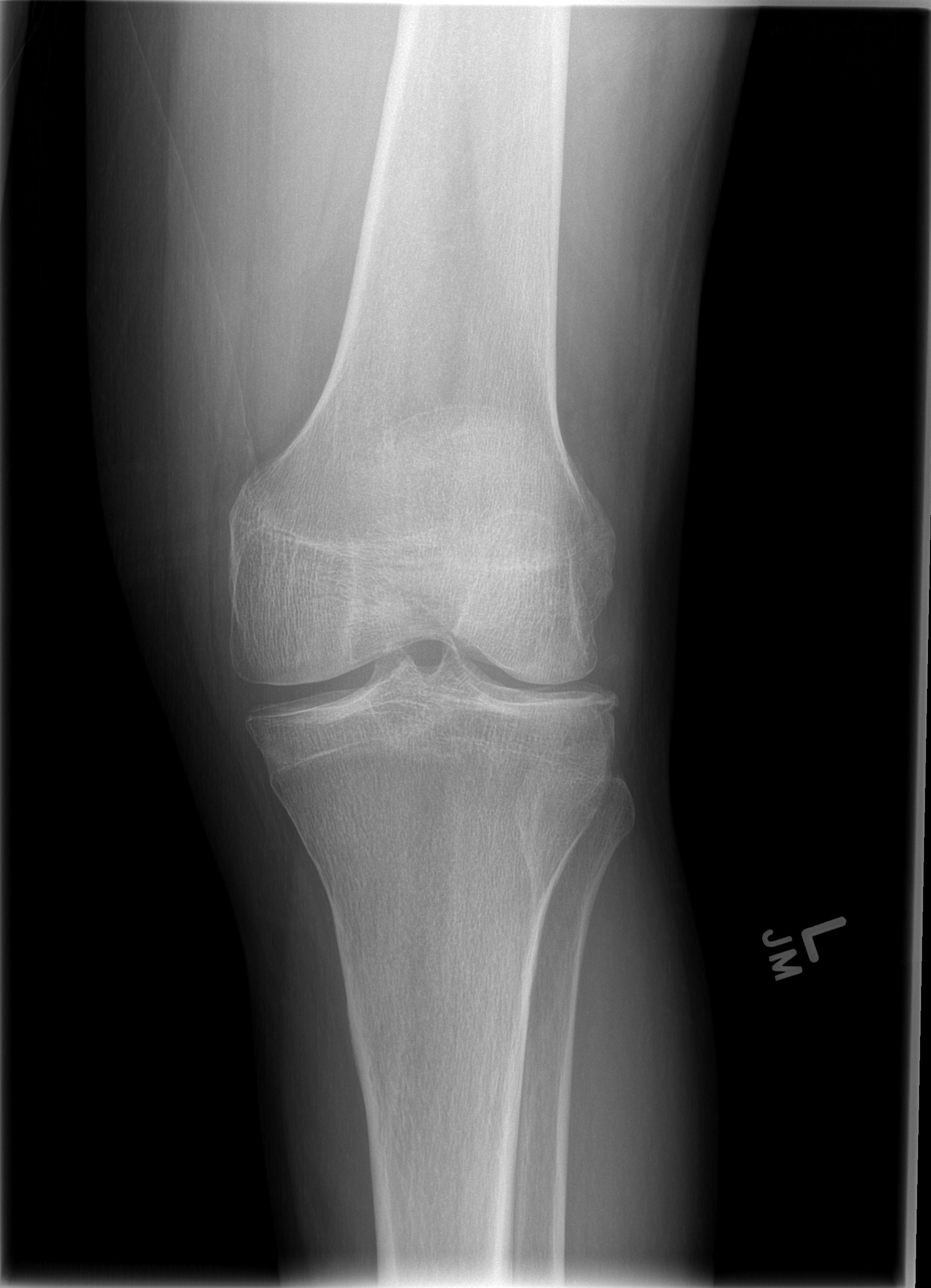

[t knee oblique left (1 of 2)]
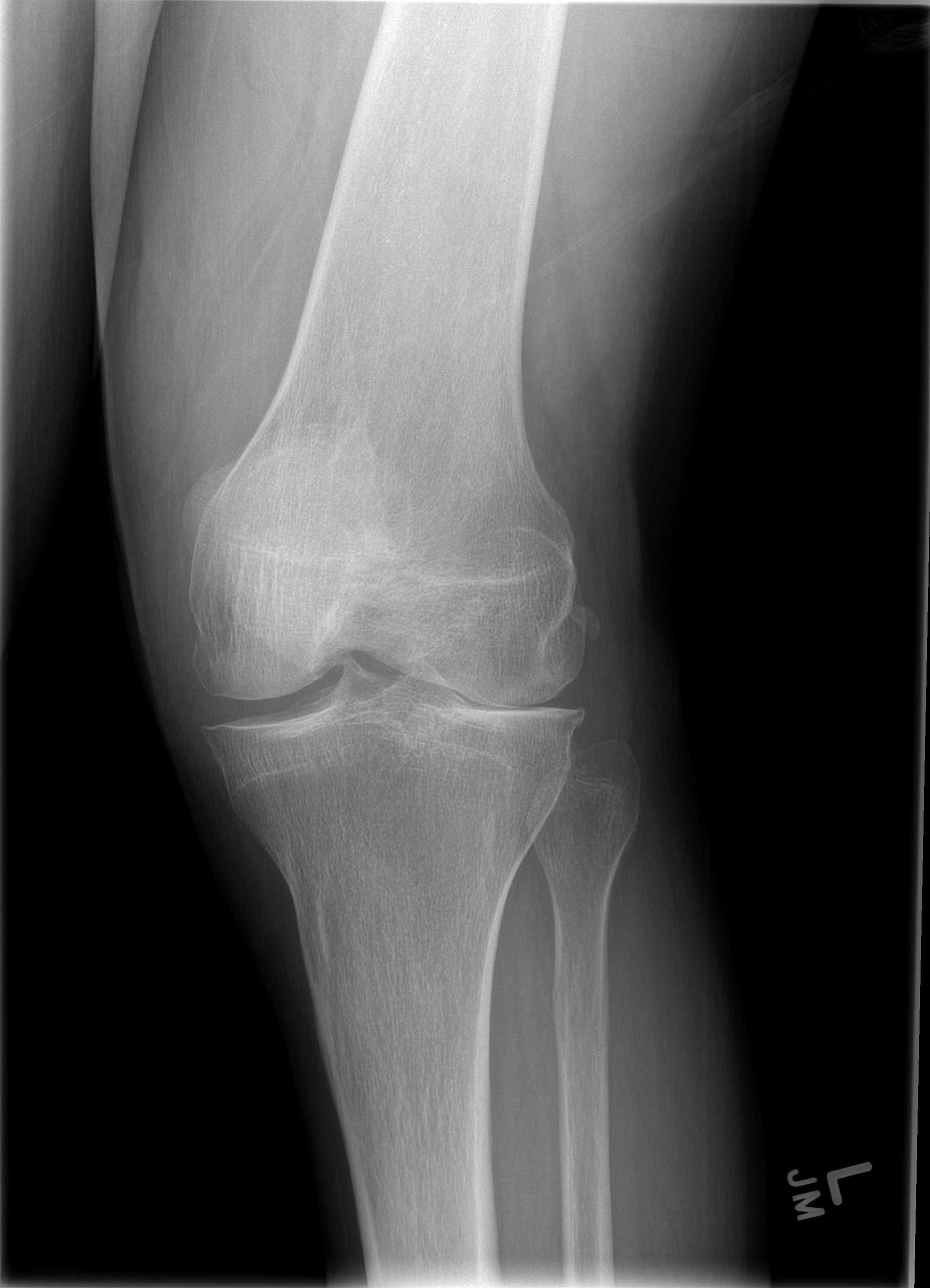

[t knee oblique left (2 of 2)]
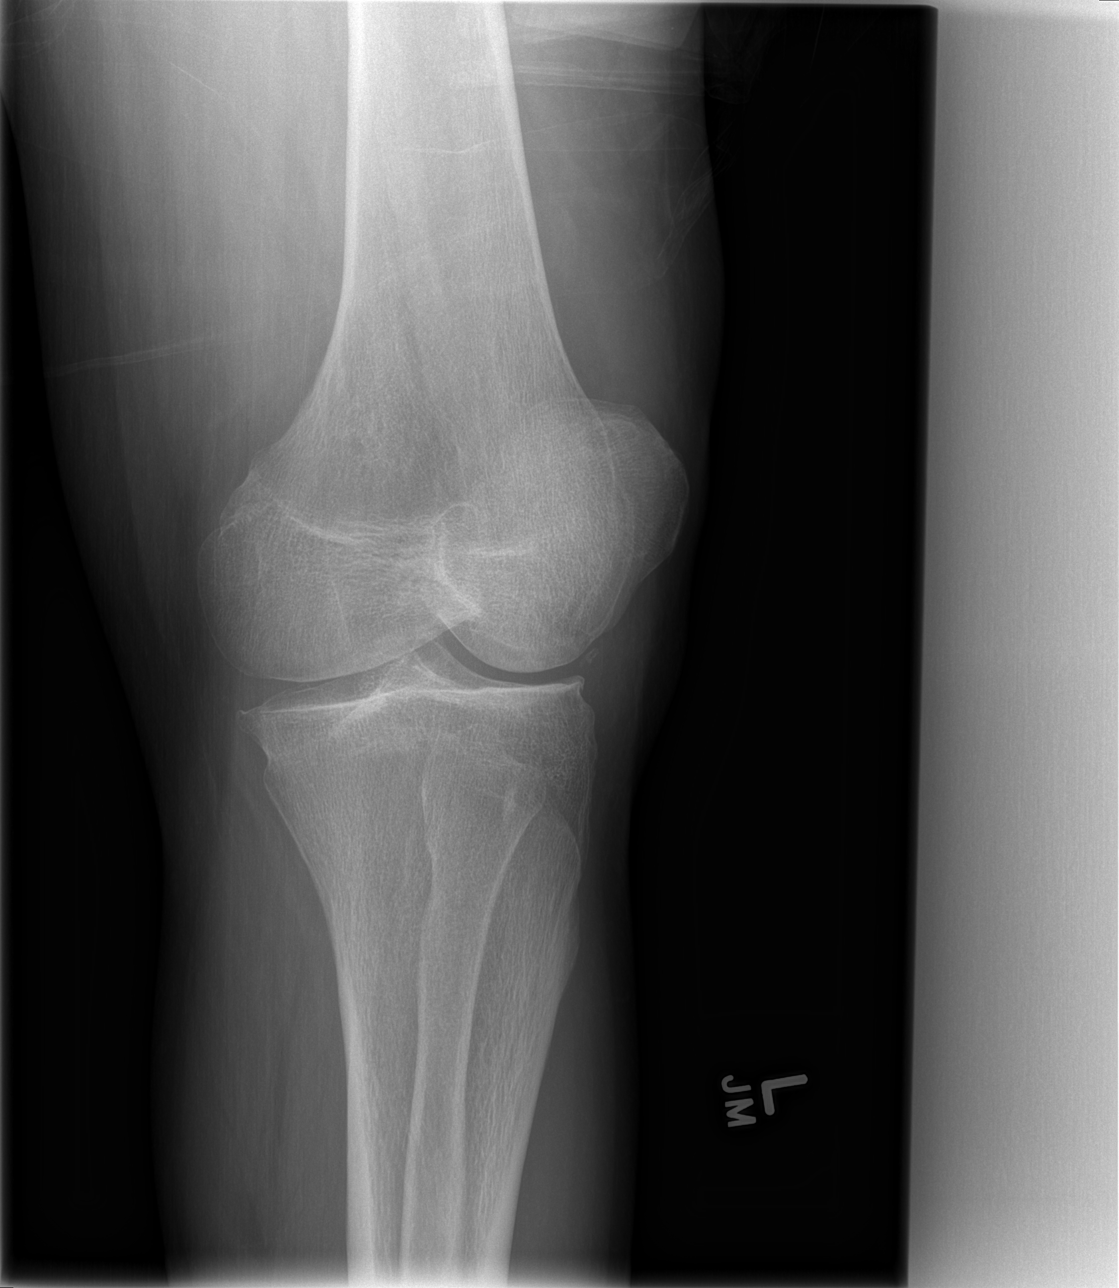

[t knee lat left]
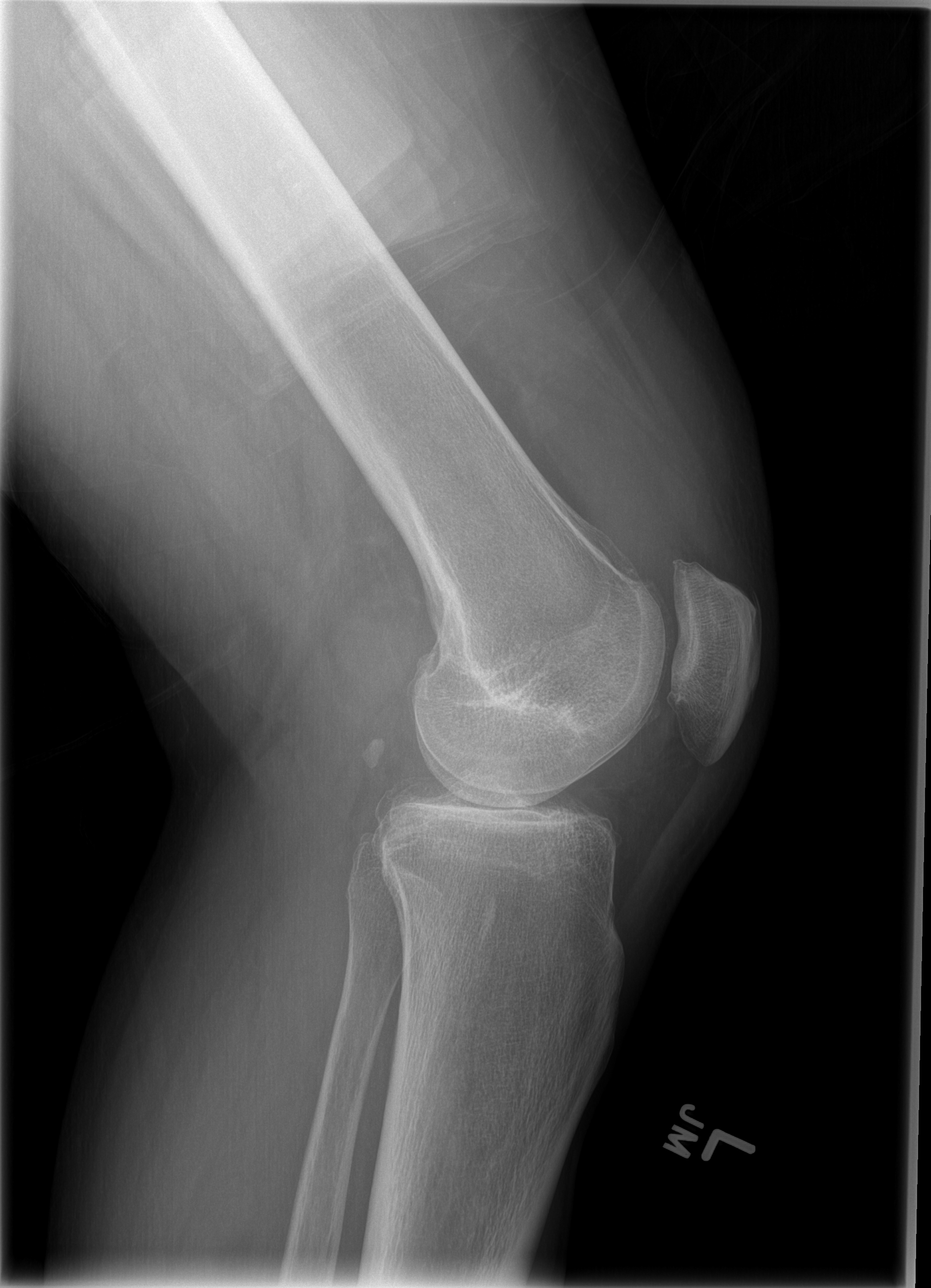

[4 of 4 positions shown; findings below may reference images not displayed]

FINDINGS: Moderate tricompartmental degenerative changes most notable in the
lateral compartment with joint space narrowing and spurring. No
acute bony abnormality or osteochondral lesion. No definite erosions
or chondrocalcinosis. Moderate to large joint effusion noted.
IMPRESSION: 1. Moderate tricompartmental degenerative changes most notable in
the lateral compartment.
2. No acute bony abnormality.
3. Moderate to large joint effusion.

## 2021-07-07 ENCOUNTER — Other Ambulatory Visit (HOSPITAL_BASED_OUTPATIENT_CLINIC_OR_DEPARTMENT_OTHER): Payer: Self-pay

## 2021-07-07 ENCOUNTER — Emergency Department (HOSPITAL_BASED_OUTPATIENT_CLINIC_OR_DEPARTMENT_OTHER)
Admission: EM | Admit: 2021-07-07 | Discharge: 2021-07-07 | Disposition: A | Discharge status: Home / Self Care | Payer: 59 | Attending: Emergency Medicine | Admitting: Emergency Medicine

## 2021-07-07 ENCOUNTER — Encounter (HOSPITAL_BASED_OUTPATIENT_CLINIC_OR_DEPARTMENT_OTHER): Payer: Self-pay | Admitting: Emergency Medicine

## 2021-07-07 ENCOUNTER — Other Ambulatory Visit: Payer: Self-pay

## 2021-07-07 DIAGNOSIS — R040 Epistaxis: Secondary | ICD-10-CM | POA: Diagnosis not present

## 2021-07-07 DIAGNOSIS — I10 Essential (primary) hypertension: Secondary | ICD-10-CM | POA: Insufficient documentation

## 2021-07-07 MED ORDER — LIDOCAINE-EPINEPHRINE-TETRACAINE (LET) TOPICAL GEL
3.0000 mL | Freq: Once | TOPICAL | Status: AC
  Administered 2021-07-07: 3 mL via TOPICAL
  Filled 2021-07-07: qty 3

## 2021-07-07 MED ORDER — SILVER NITRATE-POT NITRATE 75-25 % EX MISC
1.0000 | Freq: Once | CUTANEOUS | Status: AC
  Administered 2021-07-07: 1 via TOPICAL
  Filled 2021-07-07: qty 10

## 2021-07-07 MED ORDER — CEPHALEXIN 500 MG PO CAPS
500.0000 mg | ORAL_CAPSULE | Freq: Three times a day (TID) | ORAL | 0 refills | Status: AC
  Filled 2021-07-07: qty 15, 5d supply, fill #0

## 2021-07-07 MED ORDER — OXYMETAZOLINE HCL 0.05 % NA SOLN
1.0000 | Freq: Once | NASAL | Status: AC
  Administered 2021-07-07: 1 via NASAL
  Filled 2021-07-07: qty 30

## 2021-07-07 MED ORDER — AMLODIPINE BESYLATE 5 MG PO TABS
5.0000 mg | ORAL_TABLET | Freq: Every day | ORAL | 0 refills | Status: DC
  Filled 2021-07-07: qty 30, 30d supply, fill #0

## 2021-07-07 NOTE — ED Provider Notes (Signed)
[facility] EMERGENCY DEPARTMENT Provider Note   CSN: [id number] Arrival date & time: 07/07/21  0327     History  Chief Complaint  Patient presents with   Epistaxis    Garrett Perez is a 66 y.o. male.  The history is provided by the patient.  Epistaxis Garrett Perez is a 66 y.o. male who presents to the Emergency Department complaining of epistaxis.  He presents to the emergency department for evaluation of epistaxis from the left nare that started 30 minutes prior to ED arrival.  He states that he was at work and he noticed that his nose was running, went to blow it and saw that there was blood present.  No history of prior similar symptoms.  He does have a history of hypertension but self discontinued his blood pressure medications about 5 years ago because he felt like they made him sleepy.  Has not followed up with a PCP since then.  No history of easy bleeding or bruising.  No tobacco, alcohol, drug use.    Home Medications Prior to Admission medications   Medication Sig Start Date End Date Taking? Authorizing Provider  Ibuprofen-Famotidine 800-26.6 MG TABS Take 1 tablet by mouth 3 (three) times daily as needed. 11/28/19   Rosemarie Ax, MD  predniSONE (DELTASONE) 5 MG tablet Take 6 pills for first day, 5 pills second day, 4 pills third day, 3 pills fourth day, 2 pills the fifth day, and 1 pill sixth day. 11/28/19   Rosemarie Ax, MD      Allergies    Patient has no known allergies.    Review of Systems   Review of Systems  HENT:  Positive for nosebleeds.   All other systems reviewed and are negative.  Physical Exam Updated Vital Signs Ht 5\' 10"  (1.778 m)    Wt 83.9 kg    BMI 26.54 kg/m  Physical Exam Vitals and nursing note reviewed.  Constitutional:      Appearance: He is well-developed.  HENT:     Head: Normocephalic and atraumatic.     Comments: There is brisk bleeding from an area on the mid left septum. Cardiovascular:     Rate and Rhythm:  Normal rate and regular rhythm.     Heart sounds: No murmur heard. Pulmonary:     Effort: Pulmonary effort is normal. No respiratory distress.  Musculoskeletal:        General: No tenderness.  Skin:    General: Skin is warm and dry.  Neurological:     Mental Status: He is alert and oriented to person, place, and time.  Psychiatric:        Behavior: Behavior normal.    ED Results / Procedures / Treatments   Labs (all labs ordered are listed, but only abnormal results are displayed) Labs Reviewed - No data to display  EKG None  Radiology No results found.  Procedures .Epistaxis Management  Date/Time: 07/07/2021 5:17 AM Performed by: Quintella Reichert, MD Authorized by: Quintella Reichert, MD   Consent:    Consent obtained:  Verbal   Consent given by:  Patient   Risks, benefits, and alternatives were discussed: yes     Risks discussed:  Bleeding, infection, nasal injury and pain Universal protocol:    Patient identity confirmed:  Verbally with patient Anesthesia:    Anesthesia method:  Topical application   Topical anesthetic:  LET Procedure details:    Treatment site:  L anterior   Treatment method:  Silver nitrate  Treatment complexity:  Extensive   Treatment episode: initial   Post-procedure details:    Assessment:  No improvement   Procedure completion:  Tolerated Comments:     Patient required second treatment after having recurrent bleeding with silver nitrate application.  Merocel packing was placed in the left anterior nare.  He was observed in the emergency department for about 1 hour after packing without recurrent bleeding.    Medications Ordered in ED Medications - No data to display  ED Course/ Medical Decision Making/ A&P                           Medical Decision Making Risk OTC drugs. Prescription drug management.   Patient with history of hypertension, not currently on medications here for evaluation of epistaxis.  Patient with brisk bleeding  from the left nare on ED presentation.  Initial attempt to control bleeding with direct pressure.  Next patient cleared his naris and Afrin was applied and pressure was applied again.  Patient had ongoing bleeding.  Next he was prepped with let and silver nitrate cautery was applied.  Patient did have ongoing bleeding and ultimately required packing.  He is hypertensive in the emergency department but has no systemic symptoms, discussed checking lab work to evaluate renal function and he declines at this time.  Discussed that he does need to start on antihypertensive medications.  Discussed importance of PCP follow-up regarding his hypertension.  In terms of his epistaxis, will start on antibiotic prophylaxis with ENT follow-up and return precautions.        Final Clinical Impression(s) / ED Diagnoses Final diagnoses:  None    Rx / DC Orders ED Discharge Orders     None         Quintella Reichert, MD 07/07/21 [phone]

## 2021-07-07 NOTE — ED Triage Notes (Signed)
Pt c/o bleeding from left nare x 30 min.

## 2021-07-14 DIAGNOSIS — R040 Epistaxis: Secondary | ICD-10-CM | POA: Insufficient documentation

## 2022-04-18 ENCOUNTER — Other Ambulatory Visit: Payer: Self-pay

## 2022-04-18 ENCOUNTER — Encounter (HOSPITAL_BASED_OUTPATIENT_CLINIC_OR_DEPARTMENT_OTHER): Payer: Self-pay | Admitting: Emergency Medicine

## 2022-04-18 ENCOUNTER — Emergency Department (HOSPITAL_BASED_OUTPATIENT_CLINIC_OR_DEPARTMENT_OTHER)
Admission: EM | Admit: 2022-04-18 | Discharge: 2022-04-18 | Disposition: A | Discharge status: Home / Self Care | Payer: Managed Care, Other (non HMO) | Attending: Emergency Medicine | Admitting: Emergency Medicine

## 2022-04-18 DIAGNOSIS — Z79899 Other long term (current) drug therapy: Secondary | ICD-10-CM | POA: Diagnosis not present

## 2022-04-18 DIAGNOSIS — R040 Epistaxis: Secondary | ICD-10-CM | POA: Diagnosis present

## 2022-04-18 DIAGNOSIS — I10 Essential (primary) hypertension: Secondary | ICD-10-CM | POA: Diagnosis not present

## 2022-04-18 MED ORDER — SALINE SPRAY 0.65 % NA SOLN: 1.0000 | Freq: Every day | NASAL | 0 refills | Status: AC

## 2022-04-18 MED ORDER — OXYMETAZOLINE HCL 0.05 % NA SOLN: 1.0000 | Freq: Once | NASAL | Status: DC

## 2022-04-18 MED ORDER — OXYMETAZOLINE HCL 0.05 % NA SOLN
1.0000 | Freq: Once | NASAL | Status: AC
  Administered 2022-04-18: 1 via NASAL
  Filled 2022-04-18: qty 30

## 2022-04-18 MED ORDER — AMLODIPINE BESYLATE 5 MG PO TABS: 5.0000 mg | ORAL_TABLET | Freq: Every day | ORAL | 0 refills | Status: DC

## 2022-04-18 NOTE — Discharge Instructions (Addendum)
Thank you for coming to [facility] Emergency Department. You were seen for nosebleed. We did an exam, and this showed no active bleeding.  Please follow up with your primary care provider within 1 week. You are given the number for [facility] to call tomorrow morning to help make an appointment. Please do not stick tissues or gauze up your nose. Please purchase an over the counter nasal saline spray to use once or twice per day - this will help keep your nasal mucosa moist, especially in the winter, which can help prevent nosebleeds. Using your humidifier will also help. Please take your amlodipine as prescribed. We have refilled your prescription for this medication for your hypertension.  Do not hesitate to return to the ED or call 911 if you experience: -Worsening symptoms -Nosebleed that doesn't stop after 20 minutes of continuous pressure on the nasal bridge -Nosebleed out of both nostrils that is severe and persistent -Lightheadedness, passing out -Fevers/chills -Anything else that concerns you

## 2022-04-18 NOTE — ED Provider Notes (Signed)
[place] EMERGENCY DEPARTMENT Provider Note   CSN: [id number] Arrival date & time: 04/18/22  4401     History  Chief Complaint  Patient presents with   Epistaxis    Garrett Perez is a 66 y.o. male with h/o OA, epistaxis presents with epistaxis.   Pt reports he started to have a nosebleed from L nare at 0500 this am after blowing nose. Not on blood thinners. Says he has had nosebleed like this before which has required nasal packing on the left, taken out by ENT. He does not have a bleeding/clotting disorder, States he thinks the bleeding has slowed down now. Denies lightheadedness, CP, SOB, difficulty breathing, or nausea/vomiting. No bleeding from anywhere else. Otherwise in his [place].   Epistaxis      Home Medications Prior to Admission medications   Medication Sig Start Date End Date Taking? Authorizing Provider  sodium chloride (OCEAN) 0.65 % SOLN nasal spray Place 1 spray into both nostrils daily. 04/18/22 05/18/22 Yes Audley Hose, MD  amLODipine (NORVASC) 5 MG tablet Take 1 tablet (5 mg total) by mouth daily. 04/18/22   Audley Hose, MD  cephALEXin (KEFLEX) 500 MG capsule Take 1 capsule (500 mg total) by mouth 3 (three) times daily. 07/07/21   Quintella Reichert, MD      Allergies    Patient has no known allergies.    Review of Systems   Review of Systems  HENT:  Positive for nosebleeds.    Review of systems Negative for f/c.  A 10 point review of systems was performed and is negative unless otherwise reported in HPI.  Physical Exam Updated Vital Signs BP (!) 166/109 (BP Location: Left Arm)   Pulse 60   Temp 98 F (36.7 C) (Oral)   Resp 16   SpO2 98%  Physical Exam General: Normal appearing male, sitting in bed.  HEENT: PERRLA, Sclera anicteric, MMM, trachea midline. Dried blood at BL nares. No visible bleeding from nasal mucosa bilaterally. Small clot visible in posterior left septum.  Cardiology: RRR, no murmurs/rubs/gallops. BL radial and DP  pulses equal bilaterally.  Resp: Normal respiratory rate and effort. CTAB, no wheezes, rhonchi, crackles.  Abd: Soft, non-tender, non-distended.  GU: Deferred. MSK: No peripheral edema or signs of trauma. Skin: warm, dry. No rashes or lesions. Neuro: A&Ox4, CNs II-XII grossly intact. MAEs. Sensation grossly intact.  Psych: Normal mood and affect.   ED Results / Procedures / Treatments    Procedures Procedures    Medications Ordered in ED Medications  oxymetazoline (AFRIN) 0.05 % nasal spray 1 spray (1 spray Each Nare Given 04/18/22 0806)    ED Course/ Medical Decision Making/ A&P                          Medical Decision Making Risk OTC drugs. Prescription drug management.    This patient presents to the ED for concern of epistaxis, this involves an extensive number of treatment options, and is a complaint that carries with it a high risk of complications and morbidity.  I considered the following differential and admission for this acute, potentially life threatening condition.   MDM:    This well-appearing patient presents with epistaxis, which appears to have minimal bleeding currently.  The differential diagnosis includes anterior epistaxis, dry nasal mucosa given winter weather. Did have dried blood on right nare as well so considered posterior bleed but patient stated it was just because he was touching his face  with bloody hands, not actually bleeding from R nare. There are no history for bleeding disorders. No evidence of anemia. No hypotension or tachycardia. Patient is slightly hypertensive, states he does not have his amlodipine at home. Will treat supportively.  Intervention needed: will give oxymetazoline 1 spray  This patient on no AC presents with resolved epistaxis. The patient is without evidence of symptomatic anemia. Will treat with direct pressure, oxymetazoline.  Plan: direct pressure, oxymetazoline IN  Clinical Course as of 04/18/22 0947  [facility] Apr 18, 2022  0941 Patient reevaluated. No further bleeding after >2.5 hours here in ED. States he does not have a PCP or his amlodipine at home which he is supposed to be taking. Asymptomatic from that perspective, no CP/SOB/HA/visual changes. Educated on importance of taking his HTN medicines. Given number for [facility] to establish with PCP and prescriptions for amlodipine '5mg'$  PO qd and ocean nasal spray to use once per day to keep nasal mucosa moist. Instructed to not put anything up the nose and to use his humidifier at night when he sleeps. Instructed on how to care for nosebleeds at home and when to come back. Patient reports understanding, all questions answered to patient's satisfaction.  Dispo: DC w/ discharge instructions/return precautions. [HN]    Clinical Course User Index [HN] Audley Hose, MD     Labs: I did not feel labs were necessary at this time  Cardiac Monitoring: The patient was maintained on a cardiac monitor.  I personally viewed and interpreted the cardiac monitored which showed an underlying rhythm of: NSR  Reevaluation: After the interventions noted above, I reevaluated the patient and found that they have :improved  Social Determinants of Health: lives independently, works 2 jobs  Disposition:  Considered admission but patient improved w/ no further symptoms, ready for DC  Co morbidities that complicate the patient evaluation  Past Medical History:  Diagnosis Date   Headache    Hypertension    Personal history of colonic polyps - adenomas 06/11/2013   06/11/2013 15 mm cecal polyp, 3 mm cecal polyp, 5 mm ascending polyp - adenomas - repeat colonoscopy 2018       Medicines Meds ordered this encounter  Medications   DISCONTD: oxymetazoline (AFRIN) 0.05 % nasal spray 1 spray   oxymetazoline (AFRIN) 0.05 % nasal spray 1 spray   sodium chloride (OCEAN) 0.65 % SOLN nasal spray    Sig: Place 1 spray into both nostrils daily.    Dispense:  15 mL    Refill:   0   amLODipine (NORVASC) 5 MG tablet    Sig: Take 1 tablet (5 mg total) by mouth daily.    Dispense:  30 tablet    Refill:  0    I have reviewed the patients home medicines and have made adjustments as needed  Problem List / ED Course: Problem List Items Addressed This Visit   None Visit Diagnoses     Left-sided epistaxis    -  Primary   Hypertension, unspecified type       Relevant Medications   amLODipine (NORVASC) 5 MG tablet          This note was created using dictation software, which may contain spelling or grammatical errors.    Audley Hose, MD 04/18/22 [phone]

## 2022-04-18 NOTE — ED Triage Notes (Signed)
Pt reports he started to have a nosebleed at 0600 this am after blowing nose. Not on blood thinners. Says he has had nosebleed like this before.

## 2022-04-18 NOTE — ED Notes (Signed)
Bleeding resolved prior to giving nasal spray

## 2022-08-31 ENCOUNTER — Encounter: Payer: Self-pay | Admitting: *Deleted

## 2023-03-06 ENCOUNTER — Encounter (HOSPITAL_BASED_OUTPATIENT_CLINIC_OR_DEPARTMENT_OTHER): Payer: Self-pay

## 2023-03-06 ENCOUNTER — Other Ambulatory Visit: Payer: Self-pay

## 2023-03-06 ENCOUNTER — Emergency Department (HOSPITAL_BASED_OUTPATIENT_CLINIC_OR_DEPARTMENT_OTHER)
Admission: EM | Admit: 2023-03-06 | Discharge: 2023-03-06 | Disposition: A | Discharge status: Home / Self Care | Payer: Managed Care, Other (non HMO) | Attending: Emergency Medicine | Admitting: Emergency Medicine

## 2023-03-06 DIAGNOSIS — Z79899 Other long term (current) drug therapy: Secondary | ICD-10-CM | POA: Insufficient documentation

## 2023-03-06 DIAGNOSIS — I10 Essential (primary) hypertension: Secondary | ICD-10-CM | POA: Diagnosis not present

## 2023-03-06 DIAGNOSIS — R040 Epistaxis: Secondary | ICD-10-CM | POA: Diagnosis present

## 2023-03-06 MED ORDER — AMLODIPINE BESYLATE 5 MG PO TABS
10.0000 mg | ORAL_TABLET | Freq: Once | ORAL | Status: AC
  Administered 2023-03-06: 10 mg via ORAL
  Filled 2023-03-06: qty 2

## 2023-03-06 MED ORDER — TRANEXAMIC ACID 1000 MG/10ML IV SOLN
500.0000 mg | Freq: Once | INTRAVENOUS | Status: AC
  Administered 2023-03-06: 500 mg via TOPICAL
  Filled 2023-03-06: qty 10

## 2023-03-06 MED ORDER — SILVER NITRATE-POT NITRATE 75-25 % EX MISC
1.0000 | Freq: Once | CUTANEOUS | Status: AC
  Administered 2023-03-06: 1 via TOPICAL
  Filled 2023-03-06: qty 10

## 2023-03-06 MED ORDER — LIDOCAINE-EPINEPHRINE 2 %-1:100000 IJ SOLN
5.0000 mL | Freq: Once | INTRAMUSCULAR | Status: DC
  Filled 2023-03-06: qty 5.1

## 2023-03-06 MED ORDER — AMLODIPINE BESYLATE 10 MG PO TABS: 10.0000 mg | ORAL_TABLET | Freq: Every day | ORAL | 1 refills | Status: AC

## 2023-03-06 MED ORDER — OXYMETAZOLINE HCL 0.05 % NA SOLN
1.0000 | Freq: Once | NASAL | Status: AC
  Administered 2023-03-06: 1 via NASAL
  Filled 2023-03-06: qty 30

## 2023-03-06 MED ORDER — LIDOCAINE-EPINEPHRINE (PF) 2 %-1:200000 IJ SOLN
INTRAMUSCULAR | Status: AC
  Administered 2023-03-06: 20 mL
  Filled 2023-03-06: qty 20

## 2023-03-06 NOTE — ED Provider Notes (Signed)
[place] EMERGENCY DEPARTMENT AT [facility] Provider Note   CSN: [id number] Arrival date & time: 03/06/23  4098     History  Chief Complaint  Patient presents with   Epistaxis    Garrett Perez is a 67 y.o. male presenting to ED with spontaneous right-sided nosebleed.  This began this morning when he got out of the shower.  He denies picking at his nose or any injury.  He is not on anticoagulation or blood thinners.  He has a history of nosebleeds in the past, with prior visits to the ED, at 1 time requiring nasal packing.  HPI     Home Medications Prior to Admission medications   Medication Sig Start Date End Date Taking? Authorizing Provider  amLODipine (NORVASC) 10 MG tablet Take 1 tablet (10 mg total) by mouth daily. 03/06/23 04/05/23 Yes Jedi Catalfamo, Kermit Balo, MD  cephALEXin (KEFLEX) 500 MG capsule Take 1 capsule (500 mg total) by mouth 3 (three) times daily. 07/07/21   Tilden Fossa, MD  sodium chloride (OCEAN) 0.65 % SOLN nasal spray Place 1 spray into both nostrils daily. 04/18/22 05/18/22  Loetta Rough, MD      Allergies    Patient has no known allergies.    Review of Systems   Review of Systems  Physical Exam Updated Vital Signs BP (!) 167/106   Pulse 86   Temp 97.7 F (36.5 C) (Oral)   Resp 18   Ht 5\' 10"  (1.778 m)   Wt 83 kg   SpO2 99%   BMI 26.26 kg/m  Physical Exam Constitutional:      General: He is not in acute distress. HENT:     Head: Normocephalic and atraumatic.     Comments: Right nares active nose bleeding, cannot visualize source of bleeding.  Left nares without active bleeding Eyes:     Conjunctiva/sclera: Conjunctivae normal.     Pupils: Pupils are equal, round, and reactive to light.  Cardiovascular:     Rate and Rhythm: Normal rate and regular rhythm.  Pulmonary:     Effort: Pulmonary effort is normal. No respiratory distress.  Abdominal:     General: There is no distension.     Tenderness: There is no abdominal  tenderness.  Skin:    General: Skin is warm and dry.  Neurological:     General: No focal deficit present.     Mental Status: He is alert. Mental status is at baseline.  Psychiatric:        Mood and Affect: Mood normal.        Behavior: Behavior normal.     ED Results / Procedures / Treatments   Labs (all labs ordered are listed, but only abnormal results are displayed) Labs Reviewed - No data to display  EKG None  Radiology No results found.  Procedures .Epistaxis Management  Date/Time: 03/06/2023 12:13 PM  Performed by: Terald Sleeper, MD Authorized by: Terald Sleeper, MD   Consent:    Consent obtained:  Verbal   Consent given by:  Patient   Risks discussed:  Infection, nasal injury, pain and bleeding   Alternatives discussed:  Alternative treatment and observation Universal protocol:    Procedure explained and questions answered to patient or proxy's satisfaction: yes     Relevant documents present and verified: yes     Test results available: yes     Imaging studies available: yes     Required blood products, implants, devices, and special equipment available: yes  Site/side marked: yes     Immediately prior to procedure, a time out was called: yes     Patient identity confirmed:  Arm band Anesthesia:    Anesthesia method:  Topical application   Topical anesthetic:  Epinephrine Procedure details:    Treatment site:  R anterior   Treatment method:  Silver nitrate   Treatment complexity:  Limited   Treatment episode: initial   Post-procedure details:    Assessment:  Bleeding stopped   Procedure completion:  Tolerated well, no immediate complications     Medications Ordered in ED Medications  lidocaine-EPINEPHrine (XYLOCAINE W/EPI) 2 %-1:100000 (with pres) injection 5 mL ( Intradermal Canceled Entry 03/06/23 1053)  oxymetazoline (AFRIN) 0.05 % nasal spray 1 spray (1 spray Each Nare Given by Other 03/06/23 1008)  tranexamic acid (CYKLOKAPRON)  injection 500 mg (500 mg Topical Given by Other 03/06/23 1053)  lidocaine-EPINEPHrine (XYLOCAINE W/EPI) 2 %-1:200000 (PF) injection (20 mLs  Given by Other 03/06/23 1053)  amLODipine (NORVASC) tablet 10 mg (10 mg Oral Given 03/06/23 1048)  silver nitrate applicators applicator 1 Stick (1 Stick Topical Given by Other 03/06/23 1210)    ED Course/ Medical Decision Making/ A&P Clinical Course as of 03/06/23 1214  Sun Mar 06, 2023  0956 BP 125/80 at this time [MT]  1033 Pledglet placed in right nares w/ txa and lido w/ epi, BP elevated, pt reports he has "run out" of his BP medication [MT]  1127 Pledglet removed, no active bleeding, will monitor a period in ED [MT]    Clinical Course User Index [MT] Zarra Geffert, Kermit Balo, MD                                 Medical Decision Making Risk OTC drugs. Prescription drug management.   Patient is here with a right-sided nosebleed onset this morning.  This is atraumatic.  He is not on anticoagulation.    Patient was given Afrin, then had the right nares gently packed with a pledget soaked in TXA and lidocaine with epinephrine.  On reexamination he had stopped the bleeding.  However I was then able to visualize a source of the bead in the left anterior medial naris, we discussed the risks and benefits of cauterization versus watchful waiting at home.  He preferred to proceed with chemical cauterization and this was done with silver nitrate.  He tolerated this procedure well and was stable for discharge.  Blood pressure was elevated on arrival but did improve on reexamination.  I doubt significant blood loss anemia, and I do not believe he requires emergent blood work at this time.        Final Clinical Impression(s) / ED Diagnoses Final diagnoses:  Epistaxis  Hypertension, unspecified type  Uncontrolled hypertension    Rx / DC Orders ED Discharge Orders          Ordered    amLODipine (NORVASC) 10 MG tablet  Daily        03/06/23 1034               Terald Sleeper, MD 03/06/23 1214

## 2023-03-06 NOTE — Discharge Instructions (Addendum)

## 2023-03-06 NOTE — ED Triage Notes (Signed)
The patient having an active nose bleed since 9am.

## 2023-03-06 NOTE — ED Notes (Signed)
ED Provider at bedside.
